# Patient Record
Sex: Female | Born: 1950 | Race: White | Hispanic: No
Health system: Southern US, Community
[De-identification: ages and names within clinical notes are randomized; demographics above are authoritative.]

## PROBLEM LIST (undated history)

## (undated) DIAGNOSIS — K219 Gastro-esophageal reflux disease without esophagitis: Secondary | ICD-10-CM

## (undated) HISTORY — DX: Gastro-esophageal reflux disease without esophagitis: K21.9

## (undated) HISTORY — PX: BREAST LUMPECTOMY: SHX2

## (undated) HISTORY — PX: TUBAL LIGATION: SHX77

---

## 2020-05-25 ENCOUNTER — Other Ambulatory Visit: Payer: Self-pay

## 2020-05-25 ENCOUNTER — Emergency Department (HOSPITAL_COMMUNITY): Payer: No Typology Code available for payment source

## 2020-05-25 ENCOUNTER — Encounter (HOSPITAL_COMMUNITY)
Admission: EM | Disposition: A | Discharge status: Home / Self Care | Payer: Self-pay | Source: Home / Self Care | Attending: Emergency Medicine

## 2020-05-25 ENCOUNTER — Emergency Department (HOSPITAL_BASED_OUTPATIENT_CLINIC_OR_DEPARTMENT_OTHER): Payer: No Typology Code available for payment source

## 2020-05-25 ENCOUNTER — Observation Stay (HOSPITAL_COMMUNITY)
Admission: EM | Admit: 2020-05-25 | Discharge: 2020-05-26 | Disposition: A | Discharge status: Home / Self Care | Payer: No Typology Code available for payment source | Attending: Internal Medicine | Admitting: Internal Medicine

## 2020-05-25 ENCOUNTER — Encounter (HOSPITAL_COMMUNITY): Payer: Self-pay | Admitting: *Deleted

## 2020-05-25 DIAGNOSIS — I441 Atrioventricular block, second degree: Secondary | ICD-10-CM | POA: Diagnosis not present

## 2020-05-25 DIAGNOSIS — Z20822 Contact with and (suspected) exposure to covid-19: Secondary | ICD-10-CM | POA: Diagnosis not present

## 2020-05-25 DIAGNOSIS — I361 Nonrheumatic tricuspid (valve) insufficiency: Secondary | ICD-10-CM | POA: Diagnosis not present

## 2020-05-25 DIAGNOSIS — R55 Syncope and collapse: Secondary | ICD-10-CM

## 2020-05-25 DIAGNOSIS — I340 Nonrheumatic mitral (valve) insufficiency: Secondary | ICD-10-CM | POA: Diagnosis not present

## 2020-05-25 DIAGNOSIS — R9431 Abnormal electrocardiogram [ECG] [EKG]: Secondary | ICD-10-CM

## 2020-05-25 DIAGNOSIS — K219 Gastro-esophageal reflux disease without esophagitis: Secondary | ICD-10-CM | POA: Diagnosis not present

## 2020-05-25 DIAGNOSIS — Z79899 Other long term (current) drug therapy: Secondary | ICD-10-CM | POA: Diagnosis not present

## 2020-05-25 DIAGNOSIS — Z950 Presence of cardiac pacemaker: Secondary | ICD-10-CM

## 2020-05-25 DIAGNOSIS — I459 Conduction disorder, unspecified: Secondary | ICD-10-CM | POA: Diagnosis present

## 2020-05-25 HISTORY — PX: PACEMAKER IMPLANT: EP1218

## 2020-05-25 LAB — COMPREHENSIVE METABOLIC PANEL
ALT: 21 U/L (ref 0–44)
AST: 25 U/L (ref 15–41)
Albumin: 3.4 g/dL — ABNORMAL LOW (ref 3.5–5.0)
Alkaline Phosphatase: 52 U/L (ref 38–126)
Anion gap: 10 (ref 5–15)
BUN: 7 mg/dL — ABNORMAL LOW (ref 8–23)
CO2: 20 mmol/L — ABNORMAL LOW (ref 22–32)
Calcium: 8.4 mg/dL — ABNORMAL LOW (ref 8.9–10.3)
Chloride: 109 mmol/L (ref 98–111)
Creatinine, Ser: 0.91 mg/dL (ref 0.44–1.00)
GFR calc Af Amer: >60 mL/min (ref >60)
GFR calc non Af Amer: >60 mL/min (ref >60)
Glucose, Bld: 176 mg/dL — ABNORMAL HIGH (ref 70–99)
Potassium: 3.6 mmol/L (ref 3.5–5.1)
Sodium: 139 mmol/L (ref 135–145)
Total Bilirubin: 0.6 mg/dL (ref 0.3–1.2)
Total Protein: 5.8 g/dL — ABNORMAL LOW (ref 6.5–8.1)

## 2020-05-25 LAB — URINALYSIS, ROUTINE W REFLEX MICROSCOPIC
Bacteria, UA: NONE SEEN
Bilirubin Urine: NEGATIVE
Glucose, UA: NEGATIVE mg/dL
Hgb urine dipstick: NEGATIVE
Ketones, ur: NEGATIVE mg/dL
Leukocytes,Ua: NEGATIVE
Nitrite: NEGATIVE
Protein, ur: 100 mg/dL — AB
Specific Gravity, Urine: 1.005 (ref 1.005–1.030)
pH: 7 (ref 5.0–8.0)

## 2020-05-25 LAB — RAPID URINE DRUG SCREEN, HOSP PERFORMED
Amphetamines: NOT DETECTED
Barbiturates: NOT DETECTED
Benzodiazepines: NOT DETECTED
Cocaine: NOT DETECTED
Opiates: NOT DETECTED
Tetrahydrocannabinol: NOT DETECTED

## 2020-05-25 LAB — CBC
HCT: 37.9 % (ref 36.0–46.0)
Hemoglobin: 11.8 g/dL — ABNORMAL LOW (ref 12.0–15.0)
MCH: 28.4 pg (ref 26.0–34.0)
MCHC: 31.1 g/dL (ref 30.0–36.0)
MCV: 91.3 fL (ref 80.0–100.0)
Platelets: 129 10*3/uL — ABNORMAL LOW (ref 150–400)
RBC: 4.15 MIL/uL (ref 3.87–5.11)
RDW: 13.2 % (ref 11.5–15.5)
WBC: 6.2 10*3/uL (ref 4.0–10.5)
nRBC: 0 % (ref 0.0–0.2)

## 2020-05-25 LAB — PROTIME-INR
INR: 1.1 (ref 0.8–1.2)
Prothrombin Time: 13.5 seconds (ref 11.4–15.2)

## 2020-05-25 LAB — DIFFERENTIAL
Abs Immature Granulocytes: 0.03 10*3/uL (ref 0.00–0.07)
Basophils Absolute: 0 10*3/uL (ref 0.0–0.1)
Basophils Relative: 0 %
Eosinophils Absolute: 0 10*3/uL (ref 0.0–0.5)
Eosinophils Relative: 0 %
Immature Granulocytes: 1 %
Lymphocytes Relative: 32 %
Lymphs Abs: 2 10*3/uL (ref 0.7–4.0)
Monocytes Absolute: 0.3 10*3/uL (ref 0.1–1.0)
Monocytes Relative: 5 %
Neutro Abs: 3.8 10*3/uL (ref 1.7–7.7)
Neutrophils Relative %: 62 %

## 2020-05-25 LAB — TROPONIN I (HIGH SENSITIVITY)
Troponin I (High Sensitivity): 18 ng/L — ABNORMAL HIGH (ref <18)
Troponin I (High Sensitivity): 45 ng/L — ABNORMAL HIGH (ref <18)

## 2020-05-25 LAB — ECHOCARDIOGRAM COMPLETE
Area-P 1/2: 3.02 cm2
Height: 66 in
S' Lateral: 2.6 cm
Weight: 2480 oz

## 2020-05-25 LAB — ETHANOL: Alcohol, Ethyl (B): <10 mg/dL (ref <10)

## 2020-05-25 LAB — CBG MONITORING, ED: Glucose-Capillary: 168 mg/dL — ABNORMAL HIGH (ref 70–99)

## 2020-05-25 LAB — SARS CORONAVIRUS 2 BY RT PCR (HOSPITAL ORDER, PERFORMED IN ~~LOC~~ HOSPITAL LAB): SARS Coronavirus 2: NEGATIVE

## 2020-05-25 LAB — APTT: aPTT: 26 seconds (ref 24–36)

## 2020-05-25 SURGERY — PACEMAKER IMPLANT
Anesthesia: LOCAL

## 2020-05-25 MED ORDER — FENTANYL CITRATE (PF) 100 MCG/2ML IJ SOLN
INTRAMUSCULAR | Status: DC | PRN
Start: 2020-05-25 — End: 2020-05-25
  Administered 2020-05-25: 12.5 ug via INTRAVENOUS
  Administered 2020-05-25: 25 ug via INTRAVENOUS

## 2020-05-25 MED ORDER — CEFAZOLIN SODIUM-DEXTROSE 1-4 GM/50ML-% IV SOLN
1.0000 g | Freq: Four times a day (QID) | INTRAVENOUS | Status: AC
  Administered 2020-05-25 – 2020-05-26 (×3): 1 g via INTRAVENOUS
  Filled 2020-05-25 (×4): qty 50

## 2020-05-25 MED ORDER — CHLORHEXIDINE GLUCONATE 4 % EX LIQD
60.0000 mL | Freq: Once | CUTANEOUS | Status: DC
  Filled 2020-05-25: qty 60

## 2020-05-25 MED ORDER — SODIUM CHLORIDE 0.9% FLUSH
3.0000 mL | Freq: Two times a day (BID) | INTRAVENOUS | Status: DC
  Administered 2020-05-25 (×2): 3 mL via INTRAVENOUS

## 2020-05-25 MED ORDER — SODIUM CHLORIDE 0.9 % IV BOLUS (SEPSIS)
1000.0000 mL | Freq: Once | INTRAVENOUS | Status: AC
  Administered 2020-05-25: 1000 mL via INTRAVENOUS

## 2020-05-25 MED ORDER — ONDANSETRON HCL 4 MG/2ML IJ SOLN: 4.0000 mg | Freq: Four times a day (QID) | INTRAMUSCULAR | Status: DC | PRN

## 2020-05-25 MED ORDER — HEPARIN (PORCINE) IN NACL 1000-0.9 UT/500ML-% IV SOLN
INTRAVENOUS | Status: DC | PRN
  Administered 2020-05-25: 500 mL

## 2020-05-25 MED ORDER — MIDAZOLAM HCL 5 MG/5ML IJ SOLN
INTRAMUSCULAR | Status: AC
  Filled 2020-05-25: qty 5

## 2020-05-25 MED ORDER — CEFAZOLIN SODIUM-DEXTROSE 2-4 GM/100ML-% IV SOLN
INTRAVENOUS | Status: AC
  Filled 2020-05-25: qty 100

## 2020-05-25 MED ORDER — SODIUM CHLORIDE 0.9 % IV SOLN
80.0000 mg | INTRAVENOUS | Status: AC
  Administered 2020-05-25: 80 mg
  Filled 2020-05-25: qty 2

## 2020-05-25 MED ORDER — SODIUM CHLORIDE 0.9 % IV SOLN: INTRAVENOUS | Status: DC

## 2020-05-25 MED ORDER — CEFAZOLIN SODIUM-DEXTROSE 2-4 GM/100ML-% IV SOLN
2.0000 g | INTRAVENOUS | Status: AC
  Administered 2020-05-25: 2 g via INTRAVENOUS

## 2020-05-25 MED ORDER — SODIUM CHLORIDE 0.9 % IV SOLN: 250.0000 mL | INTRAVENOUS | Status: DC

## 2020-05-25 MED ORDER — LIDOCAINE HCL (PF) 1 % IJ SOLN
INTRAMUSCULAR | Status: DC | PRN
  Administered 2020-05-25: 50 mL

## 2020-05-25 MED ORDER — FENTANYL CITRATE (PF) 100 MCG/2ML IJ SOLN
INTRAMUSCULAR | Status: AC
  Filled 2020-05-25: qty 2

## 2020-05-25 MED ORDER — ONDANSETRON HCL 4 MG/2ML IJ SOLN
4.0000 mg | Freq: Once | INTRAMUSCULAR | Status: AC
  Administered 2020-05-25: 4 mg via INTRAVENOUS
  Filled 2020-05-25: qty 2

## 2020-05-25 MED ORDER — SODIUM CHLORIDE 0.9% FLUSH: 3.0000 mL | INTRAVENOUS | Status: DC | PRN

## 2020-05-25 MED ORDER — SODIUM CHLORIDE 0.9 % IV SOLN
1000.0000 mL | INTRAVENOUS | Status: DC
  Administered 2020-05-25: 1000 mL via INTRAVENOUS

## 2020-05-25 MED ORDER — ACETAMINOPHEN 325 MG PO TABS
325.0000 mg | ORAL_TABLET | ORAL | Status: DC | PRN
  Filled 2020-05-25: qty 2

## 2020-05-25 MED ORDER — LIDOCAINE HCL 1 % IJ SOLN
INTRAMUSCULAR | Status: AC
  Filled 2020-05-25: qty 60

## 2020-05-25 MED ORDER — MIDAZOLAM HCL 5 MG/5ML IJ SOLN
INTRAMUSCULAR | Status: DC | PRN
  Administered 2020-05-25: 1 mg via INTRAVENOUS
  Administered 2020-05-25: 2 mg via INTRAVENOUS

## 2020-05-25 MED ORDER — SODIUM CHLORIDE 0.9 % IV SOLN
INTRAVENOUS | Status: AC
  Filled 2020-05-25: qty 2

## 2020-05-25 SURGICAL SUPPLY — 12 items
CABLE SURGICAL S-101-97-12 (CABLE) ×2 IMPLANT
CATH SELECT PACE 669181 (CATHETERS) ×2 IMPLANT
CATH SELECT PACE 669183 (CATHETERS) ×2 IMPLANT
CUTTER LV DELIVERY CATHETER 7 (MISCELLANEOUS) ×2 IMPLANT
LEAD INGEVITY 7841 52 (Lead) ×2 IMPLANT
LEAD INGEVITY 7842 59 (Lead) ×2 IMPLANT
PACEMAKER ACCOLADE DR-EL (Pacemaker) ×2 IMPLANT
PAD PRO RADIOLUCENT 2001M-C (PAD) ×2 IMPLANT
SHEATH 7FR PRELUDE SNAP 13 (SHEATH) ×2 IMPLANT
SHEATH 8FR PRELUDE SNAP 13 (SHEATH) ×2 IMPLANT
TRAY PACEMAKER INSERTION (PACKS) ×2 IMPLANT
WIRE HI TORQ VERSACORE-J 145CM (WIRE) ×2 IMPLANT
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
## 2020-05-25 NOTE — Progress Notes (Signed)
  Echocardiogram 2D Echocardiogram with 3D has been performed.  Leta Jungling M 05/25/2020, 2:35 PM

## 2020-05-25 NOTE — ED Notes (Signed)
Pt is being transported to her pacemaker insertion procedure.
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
## 2020-05-25 NOTE — ED Notes (Signed)
Echo at bedside
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
## 2020-05-25 NOTE — ED Notes (Signed)
Cardiology PA at bedside. 

## 2020-05-25 NOTE — ED Notes (Signed)
Patient transported to CT 

## 2020-05-26 ENCOUNTER — Encounter (HOSPITAL_COMMUNITY): Payer: Self-pay | Admitting: Internal Medicine

## 2020-05-26 ENCOUNTER — Observation Stay (HOSPITAL_COMMUNITY): Payer: No Typology Code available for payment source

## 2020-05-26 DIAGNOSIS — I441 Atrioventricular block, second degree: Secondary | ICD-10-CM | POA: Diagnosis not present

## 2020-05-26 DIAGNOSIS — I459 Conduction disorder, unspecified: Secondary | ICD-10-CM

## 2020-05-26 MED FILL — Lidocaine HCl Local Inj 1%: INTRAMUSCULAR | Qty: 60 | Status: AC

## 2020-05-26 NOTE — Discharge Instructions (Signed)
    Supplemental Discharge Instructions for  Pacemaker/Defibrillator Patients  Activity No heavy lifting or vigorous activity with your left/right arm for 6 to 8 weeks.  Do not raise your left/right arm above your head for one week.  Gradually raise your affected arm as drawn below.              05/29/2020                  05/30/2020                   05/31/2020               06/01/2020  __  NO DRIVING for  1 week   ; you may begin driving on  06/01/2020 .  WOUND CARE - Keep the wound area clean and dry.  Do not get this area wet for one week. No showers for one week; you may shower on  06/01/2020   . - The tape/steri-strips on your wound will fall off; do not pull them off.  No bandage is needed on the site.  DO  NOT apply any creams, oils, or ointments to the wound area. - If you notice any drainage or discharge from the wound, any swelling or bruising at the site, or you develop a fever > 101? F after you are discharged home, call the office at once.  Special Instructions - You are still able to use cellular telephones; use the ear opposite the side where you have your pacemaker/defibrillator.  Avoid carrying your cellular phone near your device. - When traveling through airports, show security personnel your identification card to avoid being screened in the metal detectors.  Ask the security personnel to use the hand wand. - Avoid arc welding equipment, MRI testing (magnetic resonance imaging), TENS units (transcutaneous nerve stimulators).  Call the office for questions about other devices. - Avoid electrical appliances that are in poor condition or are not properly grounded. - Microwave ovens are safe to be near or to operate.   

## 2020-06-09 ENCOUNTER — Ambulatory Visit (INDEPENDENT_AMBULATORY_CARE_PROVIDER_SITE_OTHER): Payer: Medicare Other | Admitting: Emergency Medicine

## 2020-06-09 ENCOUNTER — Other Ambulatory Visit: Payer: Self-pay

## 2020-06-09 DIAGNOSIS — Z950 Presence of cardiac pacemaker: Secondary | ICD-10-CM

## 2020-06-09 DIAGNOSIS — I441 Atrioventricular block, second degree: Secondary | ICD-10-CM | POA: Diagnosis not present

## 2020-06-09 LAB — CUP PACEART INCLINIC DEVICE CHECK
Brady Statistic RA Percent Paced: 1 %
Brady Statistic RV Percent Paced: 2 %
Date Time Interrogation Session: 20210914190153
Implantable Lead Implant Date: 20210830
Implantable Lead Implant Date: 20210830
Implantable Lead Location: 753859
Implantable Lead Location: 753860
Implantable Lead Model: 7841
Implantable Lead Model: 7842
Implantable Lead Serial Number: 1091272
Implantable Pulse Generator Implant Date: 20210830
Lead Channel Impedance Value: 569 Ohm
Lead Channel Impedance Value: 745 Ohm
Lead Channel Pacing Threshold Amplitude: 0.6 V
Lead Channel Pacing Threshold Amplitude: 0.9 V
Lead Channel Pacing Threshold Pulse Width: 0.4 ms
Lead Channel Pacing Threshold Pulse Width: 0.4 ms
Lead Channel Sensing Intrinsic Amplitude: 7.1 mV
Lead Channel Sensing Intrinsic Amplitude: 8.8 mV
Lead Channel Setting Pacing Amplitude: 3.5 V
Lead Channel Setting Pacing Amplitude: 3.5 V
Lead Channel Setting Pacing Pulse Width: 0.4 ms
Lead Channel Setting Sensing Sensitivity: 2 mV
Pulse Gen Serial Number: 945646

## 2020-06-09 NOTE — Progress Notes (Signed)
Wound check appointment. Steri-strips removed. Wound without redness or edema. Incision edges approximated, wound well healed. Normal device function. Thresholds, sensing, and impedances consistent with implant measurements. Device programmed at 3.5V with auto capture on monitor for extra safety margin until 3 month visit. Histogram distribution appropriate for patient and level of activity. No mode switches. 1 NSVT episode, 9 beats, avg V 218bpm. Patient educated about wound care, arm mobility, lifting restrictions, and Latitude monitor. Patient is planning to establish PPM care with Dr. Shon Baton in [facility], scheduled for initial appointment on 07/01/20. Aware to request Latitude transfer at that visit. Latitude currently scheduled on 08/25/20 and ROV with Dr. Ladona Ridgel on 08/31/20.

## 2020-06-11 ENCOUNTER — Telehealth: Payer: Self-pay | Admitting: *Deleted

## 2020-06-11 NOTE — Telephone Encounter (Signed)
-----   Message from Marinus Maw, MD sent at 06/10/2020  7:57 AM EDT ----- Regarding: RE: Returning to work She can start back immediately. GT ----- Message ----- From: Charleen Kirks, RN Sent: 06/09/2020   6:49 PM EDT To: Marinus Maw, MD, Wiliam Ke, RN, # Subject: Returning to work                              Ms. 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  She does not typically have to do heavy lifting, but the job requires a lot of repetitive upper body movement.    She requests a call back tomorrow if possible, but I will be off, so I Cc'd [place] and [place].  Thanks! Irving Burton

## 2020-06-11 NOTE — Telephone Encounter (Signed)
Spoke with pt to make her aware.  She verbalizes understanding and denies additional concerns at this time.

## 2020-06-11 NOTE — Telephone Encounter (Signed)
Follow Up: ° ° ° ° °Returning your call from earlier today. °

## 2020-06-11 NOTE — Telephone Encounter (Signed)
LMOVM requesting call back to DC.  Direct number and office hours provided. °

## 2020-08-25 ENCOUNTER — Ambulatory Visit (INDEPENDENT_AMBULATORY_CARE_PROVIDER_SITE_OTHER): Payer: Medicare Other

## 2020-08-25 DIAGNOSIS — I441 Atrioventricular block, second degree: Secondary | ICD-10-CM

## 2020-08-25 LAB — CUP PACEART REMOTE DEVICE CHECK
Battery Remaining Longevity: 180 mo
Battery Remaining Percentage: 100 %
Brady Statistic RA Percent Paced: 0 %
Brady Statistic RV Percent Paced: 1 %
Date Time Interrogation Session: 20211130010100
Implantable Lead Implant Date: 20210830
Implantable Lead Implant Date: 20210830
Implantable Lead Location: 753859
Implantable Lead Location: 753860
Implantable Lead Model: 7841
Implantable Lead Model: 7842
Implantable Lead Serial Number: 1091272
Implantable Pulse Generator Implant Date: 20210830
Lead Channel Impedance Value: 587 Ohm
Lead Channel Impedance Value: 697 Ohm
Lead Channel Setting Pacing Amplitude: 2 V
Lead Channel Setting Pacing Amplitude: 2 V
Lead Channel Setting Pacing Pulse Width: 0.5 ms
Lead Channel Setting Sensing Sensitivity: 2 mV
Pulse Gen Serial Number: 945646

## 2020-08-31 ENCOUNTER — Encounter: Payer: Medicare Other | Admitting: Internal Medicine

## 2020-08-31 NOTE — Progress Notes (Signed)
Remote pacemaker transmission.   

## 2020-11-24 ENCOUNTER — Ambulatory Visit (INDEPENDENT_AMBULATORY_CARE_PROVIDER_SITE_OTHER): Payer: Medicare Other

## 2020-11-24 DIAGNOSIS — I441 Atrioventricular block, second degree: Secondary | ICD-10-CM

## 2020-11-24 LAB — CUP PACEART REMOTE DEVICE CHECK
Battery Remaining Longevity: 180 mo
Battery Remaining Percentage: 100 %
Brady Statistic RA Percent Paced: 0 %
Brady Statistic RV Percent Paced: 14 %
Date Time Interrogation Session: 20220301040400
Implantable Lead Implant Date: 20210830
Implantable Lead Implant Date: 20210830
Implantable Lead Location: 753859
Implantable Lead Location: 753860
Implantable Lead Model: 7841
Implantable Lead Model: 7842
Implantable Lead Serial Number: 1091272
Implantable Pulse Generator Implant Date: 20210830
Lead Channel Impedance Value: 610 Ohm
Lead Channel Impedance Value: 688 Ohm
Lead Channel Setting Pacing Amplitude: 2 V
Lead Channel Setting Pacing Amplitude: 2 V
Lead Channel Setting Pacing Pulse Width: 0.5 ms
Lead Channel Setting Sensing Sensitivity: 2 mV
Pulse Gen Serial Number: 945646

## 2020-12-02 NOTE — Progress Notes (Signed)
Remote pacemaker transmission.   
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
## 2021-01-06 IMAGING — CR DG CHEST 2V
2 series · 2 of 2 positions shown · non-contrast
Comparison: 05/25/2020.

CLINICAL DATA: Pacemaker.

EXAM:
CHEST - 2 VIEW

[chest lat]
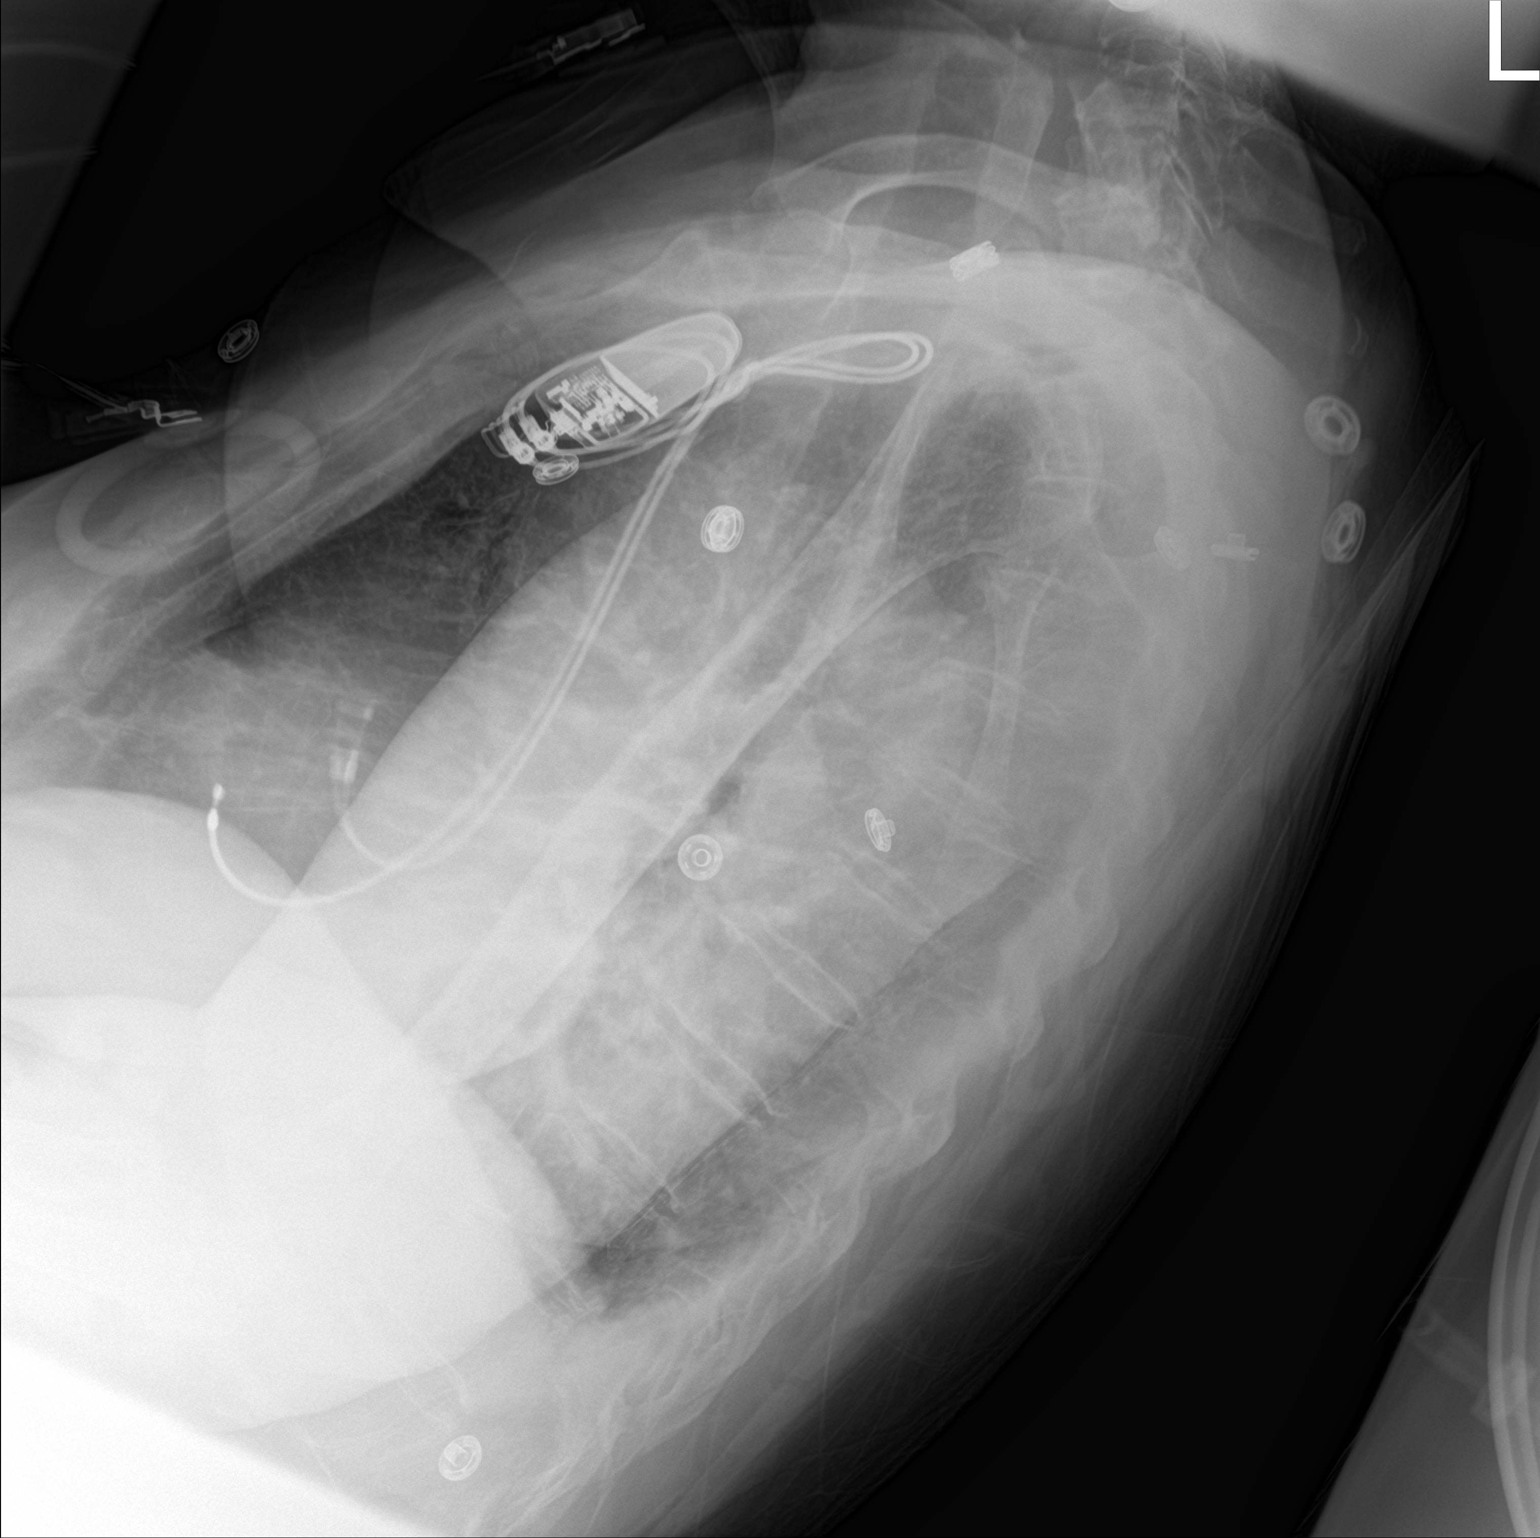

[chest ap]
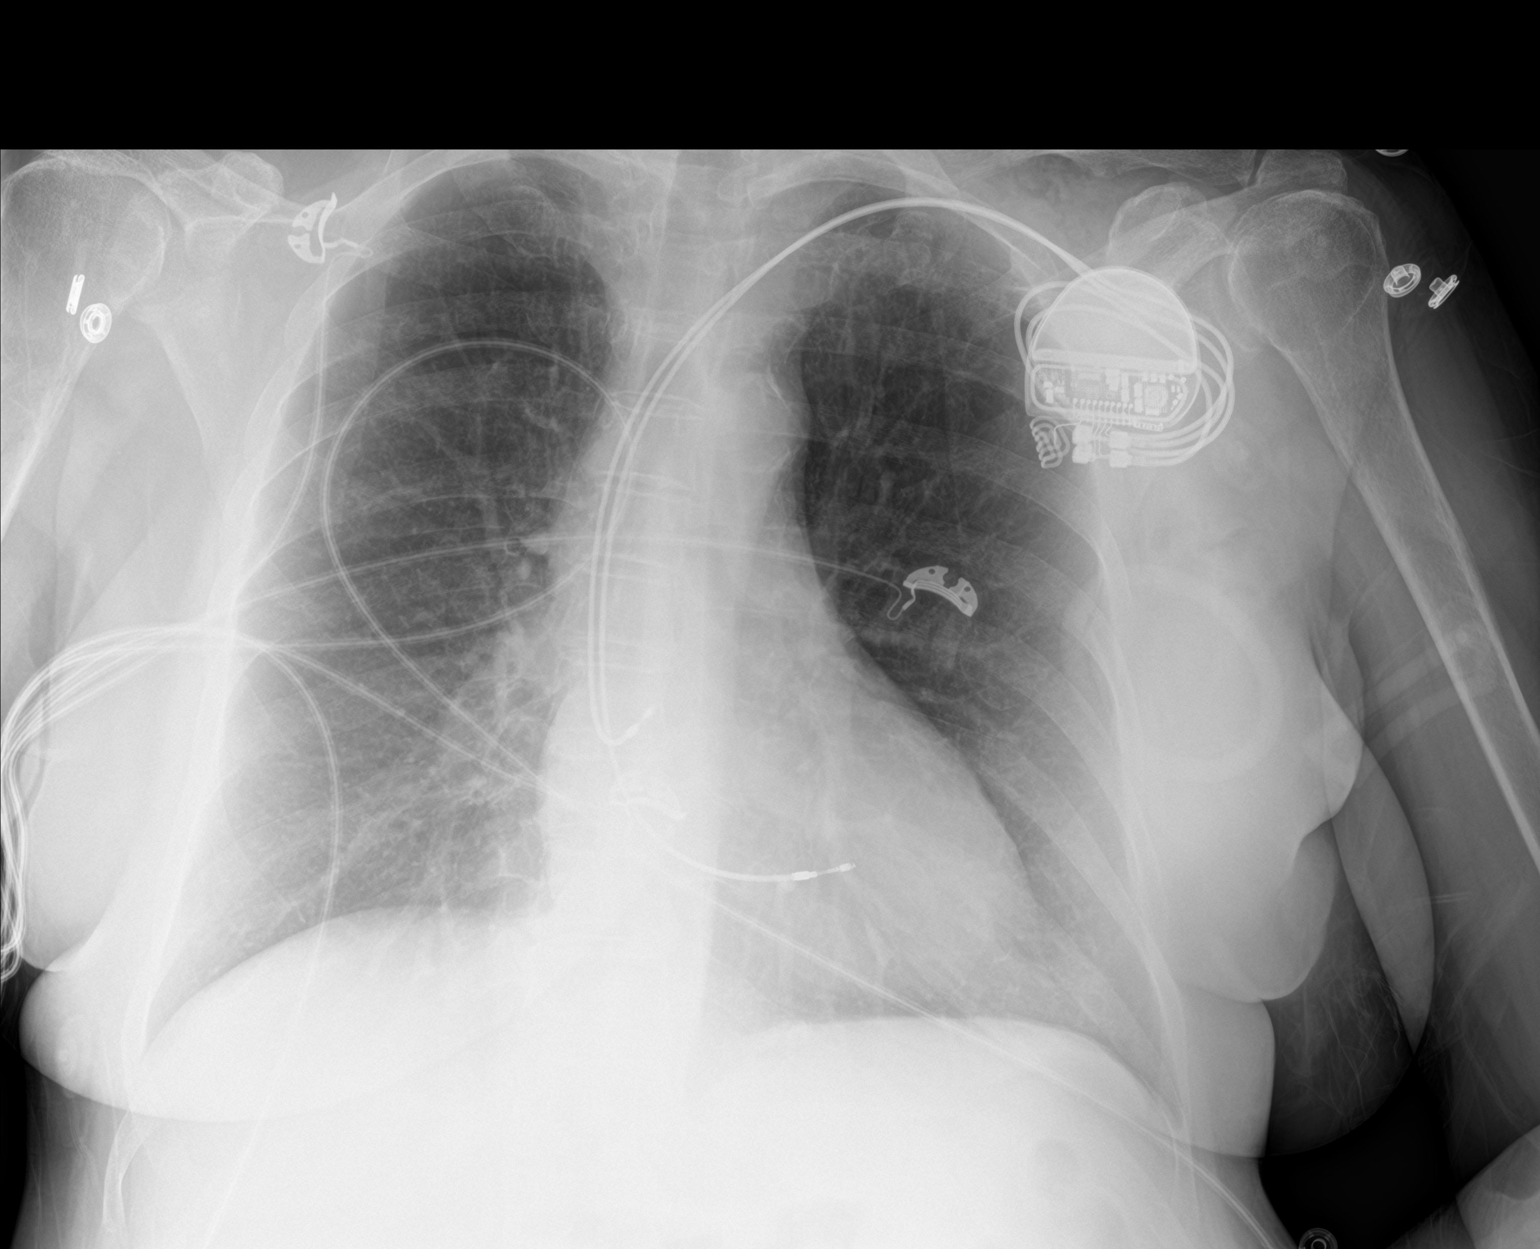

[2 of 2 positions shown; findings below may reference images not displayed]

FINDINGS: Cardiac pacer noted with lead tips in right atrium right ventricle.
Heart size normal. No focal infiltrate. No pleural effusion or
pneumothorax. Mild subcutaneous emphysema noted over the pacemaker
insertion site.
IMPRESSION: 1. Cardiac pacer with lead tips in right atrium right ventricle.
Mild subcutaneous emphysema noted adjacent to the pacer insertion
site. 2. No acute cardiopulmonary disease. No evidence of
pneumothorax.

## 2021-02-23 ENCOUNTER — Ambulatory Visit (INDEPENDENT_AMBULATORY_CARE_PROVIDER_SITE_OTHER): Payer: Medicare Other

## 2021-02-23 DIAGNOSIS — I441 Atrioventricular block, second degree: Secondary | ICD-10-CM | POA: Diagnosis not present

## 2021-02-24 LAB — CUP PACEART REMOTE DEVICE CHECK
Battery Remaining Longevity: 180 mo
Battery Remaining Percentage: 100 %
Brady Statistic RA Percent Paced: 0 %
Brady Statistic RV Percent Paced: 12 %
Date Time Interrogation Session: 20220531081700
Implantable Lead Implant Date: 20210830
Implantable Lead Implant Date: 20210830
Implantable Lead Location: 753859
Implantable Lead Location: 753860
Implantable Lead Model: 7841
Implantable Lead Model: 7842
Implantable Lead Serial Number: 1091272
Implantable Pulse Generator Implant Date: 20210830
Lead Channel Impedance Value: 633 Ohm
Lead Channel Impedance Value: 647 Ohm
Lead Channel Setting Pacing Amplitude: 2 V
Lead Channel Setting Pacing Amplitude: 2 V
Lead Channel Setting Pacing Pulse Width: 0.5 ms
Lead Channel Setting Sensing Sensitivity: 2 mV
Pulse Gen Serial Number: 945646

## 2021-03-17 NOTE — Progress Notes (Signed)
Remote pacemaker transmission.   

## 2021-05-25 ENCOUNTER — Ambulatory Visit (INDEPENDENT_AMBULATORY_CARE_PROVIDER_SITE_OTHER): Payer: Medicare Other

## 2021-05-25 DIAGNOSIS — I441 Atrioventricular block, second degree: Secondary | ICD-10-CM

## 2021-05-25 LAB — CUP PACEART REMOTE DEVICE CHECK
Battery Remaining Longevity: 174 mo
Battery Remaining Percentage: 100 %
Brady Statistic RA Percent Paced: 0 %
Brady Statistic RV Percent Paced: 11 %
Date Time Interrogation Session: 20220830010100
Implantable Lead Implant Date: 20210830
Implantable Lead Implant Date: 20210830
Implantable Lead Location: 753859
Implantable Lead Location: 753860
Implantable Lead Model: 7841
Implantable Lead Model: 7842
Implantable Lead Serial Number: 1091272
Implantable Pulse Generator Implant Date: 20210830
Lead Channel Impedance Value: 643 Ohm
Lead Channel Impedance Value: 669 Ohm
Lead Channel Setting Pacing Amplitude: 2 V
Lead Channel Setting Pacing Amplitude: 2 V
Lead Channel Setting Pacing Pulse Width: 0.5 ms
Lead Channel Setting Sensing Sensitivity: 2 mV
Pulse Gen Serial Number: 945646

## 2021-06-07 NOTE — Progress Notes (Signed)
Remote pacemaker transmission.   

## 2021-08-24 ENCOUNTER — Ambulatory Visit (INDEPENDENT_AMBULATORY_CARE_PROVIDER_SITE_OTHER): Payer: Medicare Other

## 2021-08-24 DIAGNOSIS — I441 Atrioventricular block, second degree: Secondary | ICD-10-CM | POA: Diagnosis not present

## 2021-08-25 LAB — CUP PACEART REMOTE DEVICE CHECK
Battery Remaining Longevity: 180 mo
Battery Remaining Percentage: 100 %
Brady Statistic RA Percent Paced: 0 %
Brady Statistic RV Percent Paced: 12 %
Date Time Interrogation Session: 20221130115600
Implantable Lead Implant Date: 20210830
Implantable Lead Implant Date: 20210830
Implantable Lead Location: 753859
Implantable Lead Location: 753860
Implantable Lead Model: 7841
Implantable Lead Model: 7842
Implantable Lead Serial Number: 1091272
Implantable Pulse Generator Implant Date: 20210830
Lead Channel Impedance Value: 654 Ohm
Lead Channel Impedance Value: 700 Ohm
Lead Channel Setting Pacing Amplitude: 2 V
Lead Channel Setting Pacing Amplitude: 2 V
Lead Channel Setting Pacing Pulse Width: 0.5 ms
Lead Channel Setting Sensing Sensitivity: 2 mV
Pulse Gen Serial Number: 945646

## 2021-09-02 NOTE — Progress Notes (Signed)
Remote pacemaker transmission.   

## 2021-11-23 ENCOUNTER — Ambulatory Visit (INDEPENDENT_AMBULATORY_CARE_PROVIDER_SITE_OTHER): Payer: Medicare Other

## 2021-11-23 DIAGNOSIS — I441 Atrioventricular block, second degree: Secondary | ICD-10-CM

## 2021-11-24 LAB — CUP PACEART REMOTE DEVICE CHECK
Battery Remaining Longevity: 150 mo
Battery Remaining Percentage: 100 %
Brady Statistic RA Percent Paced: 0 %
Brady Statistic RV Percent Paced: 27 %
Date Time Interrogation Session: 20230301102300
Implantable Lead Implant Date: 20210830
Implantable Lead Implant Date: 20210830
Implantable Lead Location: 753859
Implantable Lead Location: 753860
Implantable Lead Model: 7841
Implantable Lead Model: 7842
Implantable Lead Serial Number: 1091272
Implantable Pulse Generator Implant Date: 20210830
Lead Channel Impedance Value: 667 Ohm
Lead Channel Impedance Value: 723 Ohm
Lead Channel Setting Pacing Amplitude: 2 V
Lead Channel Setting Pacing Amplitude: 2 V
Lead Channel Setting Pacing Pulse Width: 0.5 ms
Lead Channel Setting Sensing Sensitivity: 2 mV
Pulse Gen Serial Number: 945646

## 2021-11-30 NOTE — Progress Notes (Signed)
Remote pacemaker transmission.   

## 2022-02-22 ENCOUNTER — Ambulatory Visit (INDEPENDENT_AMBULATORY_CARE_PROVIDER_SITE_OTHER): Payer: Medicare Other

## 2022-02-22 DIAGNOSIS — I441 Atrioventricular block, second degree: Secondary | ICD-10-CM | POA: Diagnosis not present

## 2022-02-22 LAB — CUP PACEART REMOTE DEVICE CHECK
Battery Remaining Longevity: 150 mo
Battery Remaining Percentage: 100 %
Brady Statistic RA Percent Paced: 0 %
Brady Statistic RV Percent Paced: 39 %
Date Time Interrogation Session: 20230530022000
Implantable Lead Implant Date: 20210830
Implantable Lead Implant Date: 20210830
Implantable Lead Location: 753859
Implantable Lead Location: 753860
Implantable Lead Model: 7841
Implantable Lead Model: 7842
Implantable Lead Serial Number: 1091272
Implantable Pulse Generator Implant Date: 20210830
Lead Channel Impedance Value: 647 Ohm
Lead Channel Impedance Value: 713 Ohm
Lead Channel Setting Pacing Amplitude: 2 V
Lead Channel Setting Pacing Amplitude: 2 V
Lead Channel Setting Pacing Pulse Width: 0.5 ms
Lead Channel Setting Sensing Sensitivity: 2 mV
Pulse Gen Serial Number: 945646

## 2022-03-10 NOTE — Progress Notes (Signed)
Remote pacemaker transmission.   

## 2022-05-24 ENCOUNTER — Ambulatory Visit (INDEPENDENT_AMBULATORY_CARE_PROVIDER_SITE_OTHER): Payer: Medicare Other

## 2022-05-24 DIAGNOSIS — I441 Atrioventricular block, second degree: Secondary | ICD-10-CM | POA: Diagnosis not present

## 2022-05-24 LAB — CUP PACEART REMOTE DEVICE CHECK
Battery Remaining Longevity: 144 mo
Battery Remaining Percentage: 100 %
Brady Statistic RA Percent Paced: 0 %
Brady Statistic RV Percent Paced: 47 %
Date Time Interrogation Session: 20230829014800
Implantable Lead Implant Date: 20210830
Implantable Lead Implant Date: 20210830
Implantable Lead Location: 753859
Implantable Lead Location: 753860
Implantable Lead Model: 7841
Implantable Lead Model: 7842
Implantable Lead Serial Number: 1091272
Implantable Pulse Generator Implant Date: 20210830
Lead Channel Impedance Value: 536 Ohm
Lead Channel Impedance Value: 645 Ohm
Lead Channel Setting Pacing Amplitude: 2 V
Lead Channel Setting Pacing Amplitude: 2 V
Lead Channel Setting Pacing Pulse Width: 0.5 ms
Lead Channel Setting Sensing Sensitivity: 2 mV
Pulse Gen Serial Number: 945646

## 2022-06-17 NOTE — Progress Notes (Signed)
Remote pacemaker transmission.   

## 2022-08-23 ENCOUNTER — Ambulatory Visit (INDEPENDENT_AMBULATORY_CARE_PROVIDER_SITE_OTHER): Payer: Medicare Other

## 2022-08-23 DIAGNOSIS — I441 Atrioventricular block, second degree: Secondary | ICD-10-CM | POA: Diagnosis not present

## 2022-08-23 LAB — CUP PACEART REMOTE DEVICE CHECK
Battery Remaining Longevity: 138 mo
Battery Remaining Percentage: 100 %
Brady Statistic RA Percent Paced: 0 %
Brady Statistic RV Percent Paced: 54 %
Date Time Interrogation Session: 20231128010000
Implantable Lead Connection Status: 753985
Implantable Lead Connection Status: 753985
Implantable Lead Implant Date: 20210830
Implantable Lead Implant Date: 20210830
Implantable Lead Location: 753859
Implantable Lead Location: 753860
Implantable Lead Model: 7841
Implantable Lead Model: 7842
Implantable Lead Serial Number: 1091272
Implantable Pulse Generator Implant Date: 20210830
Lead Channel Impedance Value: 521 Ohm
Lead Channel Impedance Value: 637 Ohm
Lead Channel Setting Pacing Amplitude: 2 V
Lead Channel Setting Pacing Amplitude: 2 V
Lead Channel Setting Pacing Pulse Width: 0.5 ms
Lead Channel Setting Sensing Sensitivity: 2 mV
Pulse Gen Serial Number: 945646
Zone Setting Status: 755011

## 2022-09-22 NOTE — Progress Notes (Signed)
Remote pacemaker transmission.   

## 2022-10-25 ENCOUNTER — Telehealth: Payer: Self-pay | Admitting: Internal Medicine

## 2022-10-25 NOTE — Telephone Encounter (Signed)
[facility] is calling to inform about a form. Please call back. [phone] or [phone] and ask for [place]man[place].

## 2022-10-26 NOTE — Telephone Encounter (Signed)
Carol Ochoa called back [place]  Ms. Carol Ochoa stated they are seeking pacemaker cardiac clearance from Dr. Lovena Le;  They will fax a form to me, at our [phone] fax number, to have Dr. Lovena Le review and sign this Friday, 10/28/2022.   Will obtain this fax, and have signed, and return to [facility].

## 2022-11-01 NOTE — Telephone Encounter (Signed)
Fax not received, called the [phone] telephone number and left detailed message for Carol Ochoa, that we did NOT receive the fax for pacemaker clearance as noted below.    [facility]'s main telephone number and fax number shared again, with detailed instructions to contact our office to complete this in my absence.

## 2022-11-22 ENCOUNTER — Ambulatory Visit: Payer: Medicare Other

## 2022-11-22 DIAGNOSIS — I441 Atrioventricular block, second degree: Secondary | ICD-10-CM

## 2022-11-23 LAB — CUP PACEART REMOTE DEVICE CHECK
Battery Remaining Longevity: 138 mo
Battery Remaining Percentage: 100 %
Brady Statistic RA Percent Paced: 0 %
Brady Statistic RV Percent Paced: 59 %
Date Time Interrogation Session: 20240227010200
Implantable Lead Connection Status: 753985
Implantable Lead Connection Status: 753985
Implantable Lead Implant Date: 20210830
Implantable Lead Implant Date: 20210830
Implantable Lead Location: 753859
Implantable Lead Location: 753860
Implantable Lead Model: 7841
Implantable Lead Model: 7842
Implantable Lead Serial Number: 1091272
Implantable Pulse Generator Implant Date: 20210830
Lead Channel Impedance Value: 523 Ohm
Lead Channel Impedance Value: 651 Ohm
Lead Channel Setting Pacing Amplitude: 2 V
Lead Channel Setting Pacing Amplitude: 2 V
Lead Channel Setting Pacing Pulse Width: 0.5 ms
Lead Channel Setting Sensing Sensitivity: 2 mV
Pulse Gen Serial Number: 945646
Zone Setting Status: 755011

## 2022-12-28 NOTE — Progress Notes (Signed)
Remote pacemaker transmission.   

## 2023-02-21 ENCOUNTER — Ambulatory Visit (INDEPENDENT_AMBULATORY_CARE_PROVIDER_SITE_OTHER): Payer: Medicare Other

## 2023-02-21 DIAGNOSIS — I441 Atrioventricular block, second degree: Secondary | ICD-10-CM

## 2023-02-22 LAB — CUP PACEART REMOTE DEVICE CHECK
Battery Remaining Longevity: 132 mo
Battery Remaining Percentage: 100 %
Brady Statistic RA Percent Paced: 0 %
Brady Statistic RV Percent Paced: 63 %
Date Time Interrogation Session: 20240528010100
Implantable Lead Connection Status: 753985
Implantable Lead Connection Status: 753985
Implantable Lead Implant Date: 20210830
Implantable Lead Implant Date: 20210830
Implantable Lead Location: 753859
Implantable Lead Location: 753860
Implantable Lead Model: 7841
Implantable Lead Model: 7842
Implantable Lead Serial Number: 1091272
Implantable Pulse Generator Implant Date: 20210830
Lead Channel Impedance Value: 527 Ohm
Lead Channel Impedance Value: 657 Ohm
Lead Channel Setting Pacing Amplitude: 2 V
Lead Channel Setting Pacing Amplitude: 2 V
Lead Channel Setting Pacing Pulse Width: 0.5 ms
Lead Channel Setting Sensing Sensitivity: 2 mV
Pulse Gen Serial Number: 945646
Zone Setting Status: 755011

## 2023-03-17 NOTE — Progress Notes (Signed)
Remote pacemaker transmission.   

## 2023-05-23 ENCOUNTER — Ambulatory Visit: Payer: Medicare Other

## 2023-08-22 ENCOUNTER — Ambulatory Visit: Payer: Medicare Other

## 2023-11-21 ENCOUNTER — Ambulatory Visit: Payer: Medicare Other

## 2024-02-20 ENCOUNTER — Ambulatory Visit (INDEPENDENT_AMBULATORY_CARE_PROVIDER_SITE_OTHER): Payer: Medicare Other

## 2024-02-20 DIAGNOSIS — I441 Atrioventricular block, second degree: Secondary | ICD-10-CM

## 2024-04-10 NOTE — Progress Notes (Signed)
 Remote pacemaker transmission.

## 2024-05-21 ENCOUNTER — Ambulatory Visit: Payer: Medicare Other

## 2024-08-20 ENCOUNTER — Ambulatory Visit: Payer: Medicare Other

## 2024-11-19 ENCOUNTER — Ambulatory Visit: Payer: Medicare Other
